# Patient Record
Sex: Female | Born: 1955 | Race: White | Hispanic: No | Marital: Single | Smoking: Former smoker
Health system: Southern US, Community
[De-identification: ages and names within clinical notes are randomized; demographics above are authoritative.]

## PROBLEM LIST (undated history)

## (undated) DIAGNOSIS — K219 Gastro-esophageal reflux disease without esophagitis: Secondary | ICD-10-CM

## (undated) DIAGNOSIS — G4730 Sleep apnea, unspecified: Secondary | ICD-10-CM

## (undated) DIAGNOSIS — J449 Chronic obstructive pulmonary disease, unspecified: Secondary | ICD-10-CM

## (undated) DIAGNOSIS — J45909 Unspecified asthma, uncomplicated: Secondary | ICD-10-CM

## (undated) DIAGNOSIS — I7300 Raynaud's syndrome without gangrene: Secondary | ICD-10-CM

## (undated) DIAGNOSIS — R002 Palpitations: Secondary | ICD-10-CM

## (undated) DIAGNOSIS — E785 Hyperlipidemia, unspecified: Secondary | ICD-10-CM

## (undated) HISTORY — DX: Gastro-esophageal reflux disease without esophagitis: K21.9

## (undated) HISTORY — DX: Sleep apnea, unspecified: G47.30

## (undated) HISTORY — DX: Raynaud's syndrome without gangrene: I73.00

## (undated) HISTORY — DX: Chronic obstructive pulmonary disease, unspecified: J44.9

## (undated) HISTORY — DX: Hyperlipidemia, unspecified: E78.5

## (undated) HISTORY — DX: Palpitations: R00.2

## (undated) HISTORY — DX: Unspecified asthma, uncomplicated: J45.909

---
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
## 1978-10-31 HISTORY — PX: APPENDECTOMY: SHX54

## 1985-10-31 HISTORY — PX: ABDOMINAL HYSTERECTOMY: SHX81

## 2009-08-19 ENCOUNTER — Inpatient Hospital Stay: Payer: Self-pay | Admitting: Internal Medicine

## 2010-06-25 IMAGING — CT CT HEAD WITHOUT CONTRAST
2 series · 16 of 30 positions shown, 20 images · non-contrast
Comparison: none

REASON FOR EXAM: Lt. facial numbness and LUE numbness
COMMENTS:

[Series 2: without · axial · non-contrast · 0.40mm/px · z∈[+1022,+1142]mm · 13 of 29 slices shown, 17 images]
[im 3/29  brain]
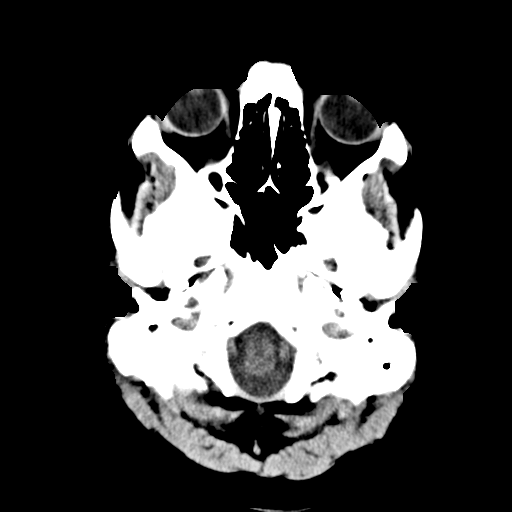
[im 3/29  bone]
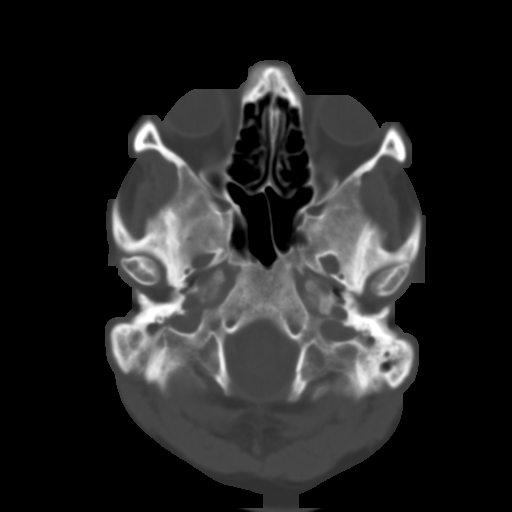
[im 5/29  brain]
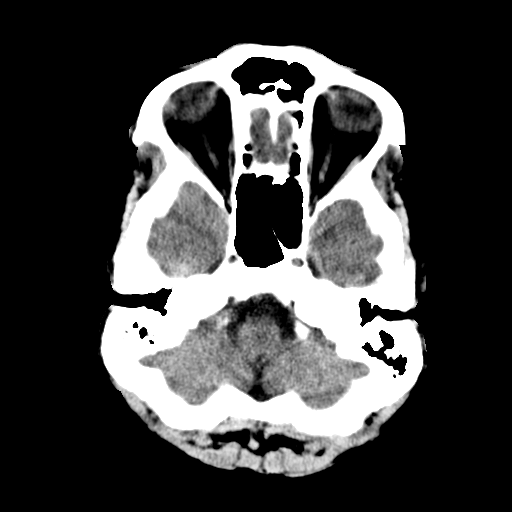
[im 7/29  brain]
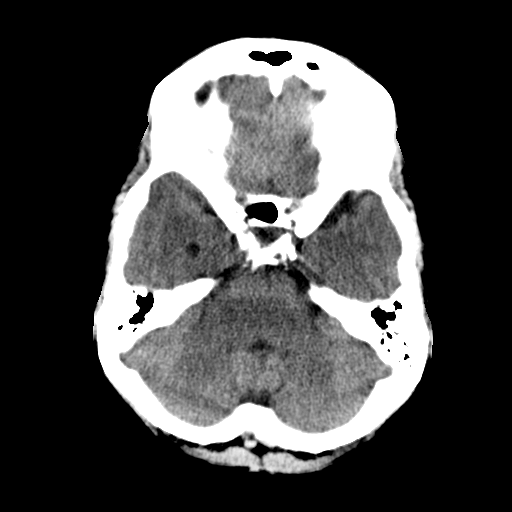
[im 9/29  brain]
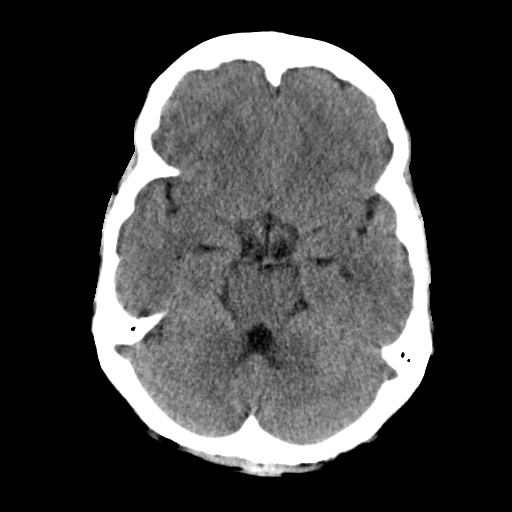
[im 11/29  brain]
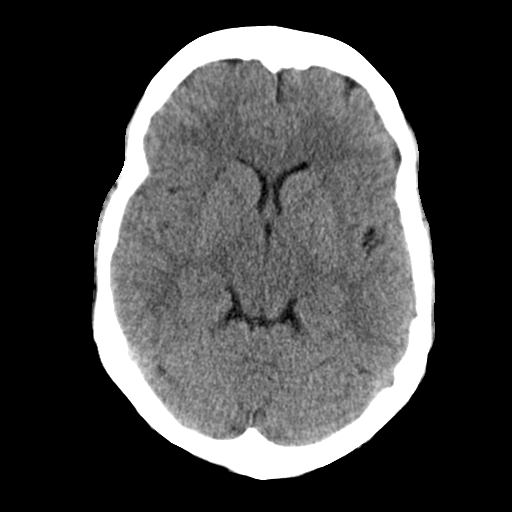
[im 11/29  bone]
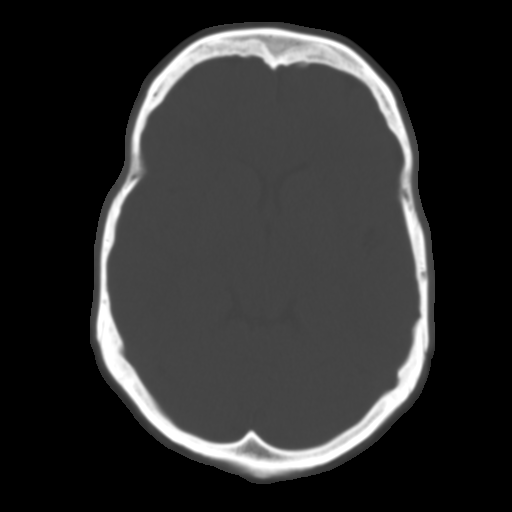
[im 13/29  brain]
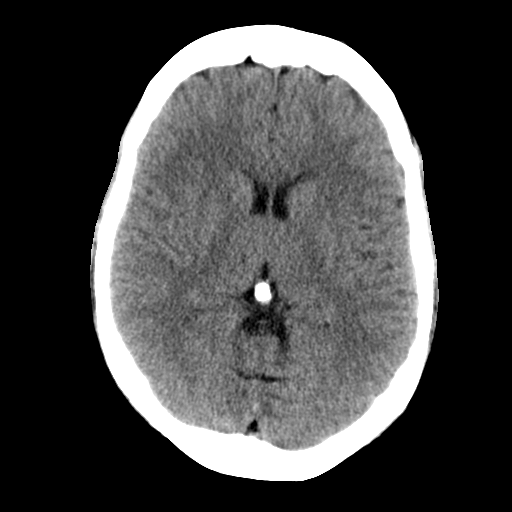
[im 15/29  brain]
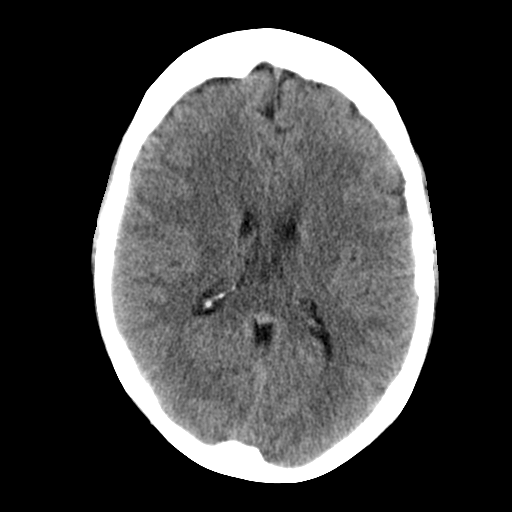
[im 17/29  brain]
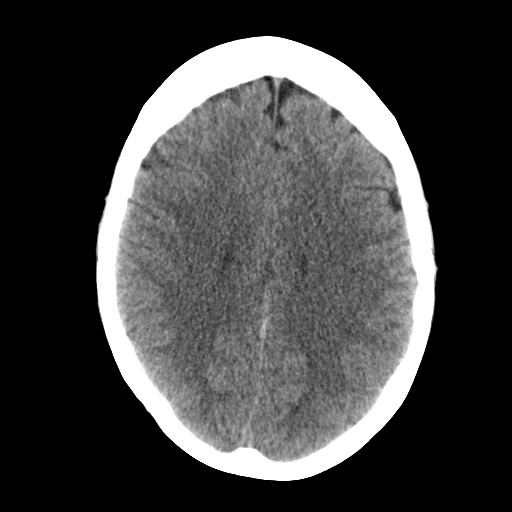
[im 19/29  brain]
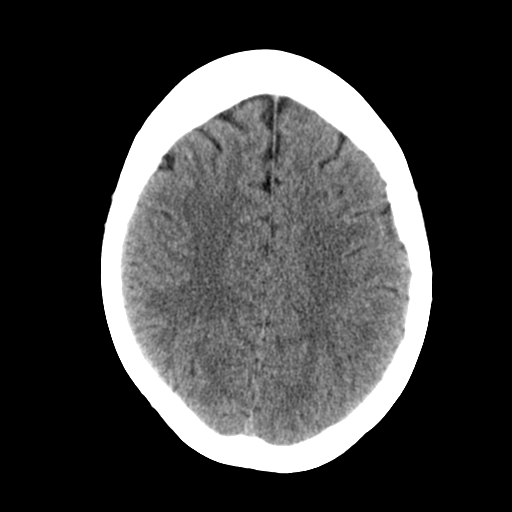
[im 19/29  bone]
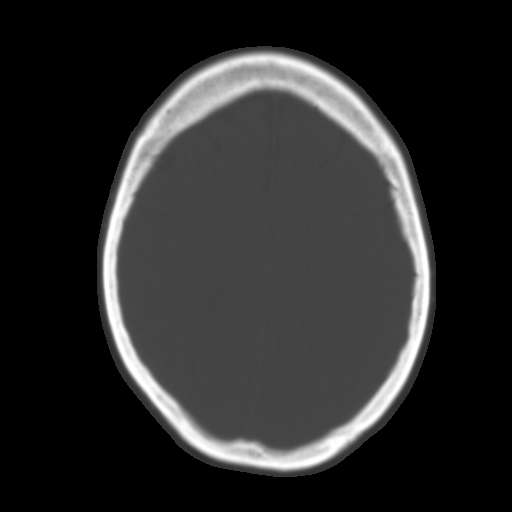
[im 21/29  brain]
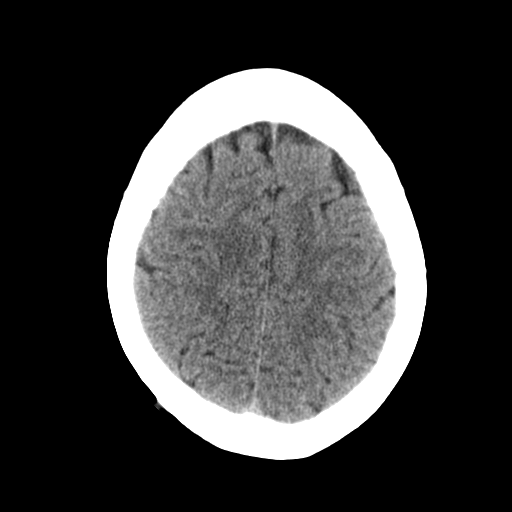
[im 23/29  brain]
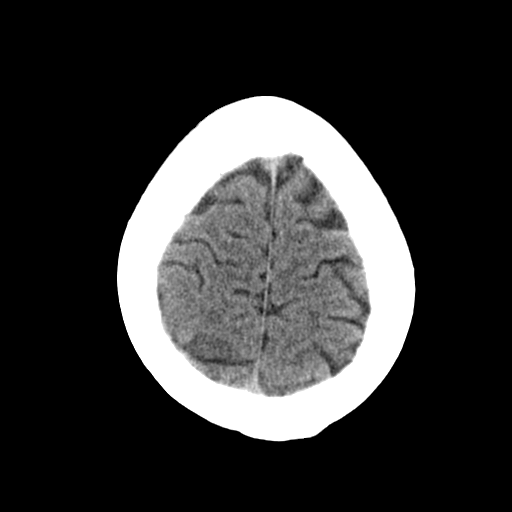
[im 25/29  brain]
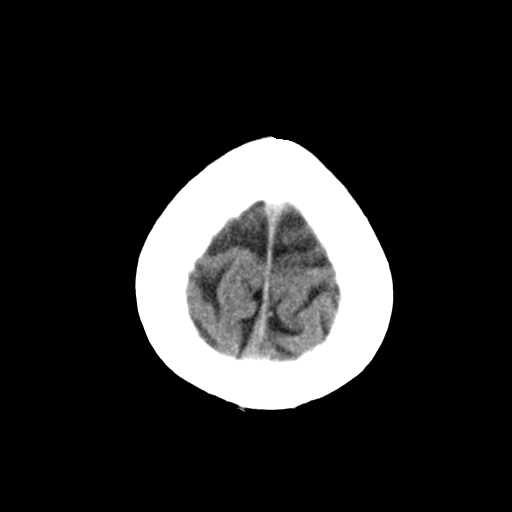
[im 27/29  brain]
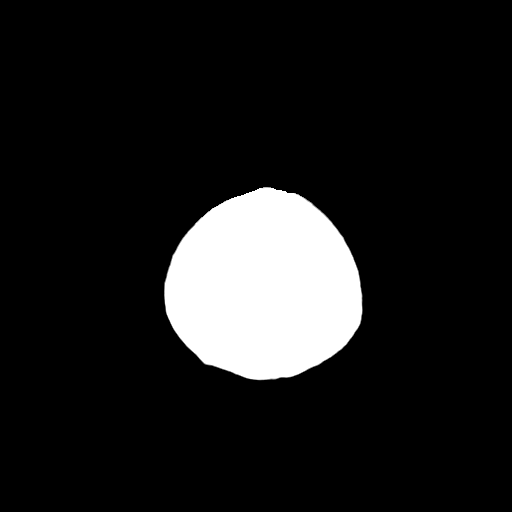
[im 27/29  bone]
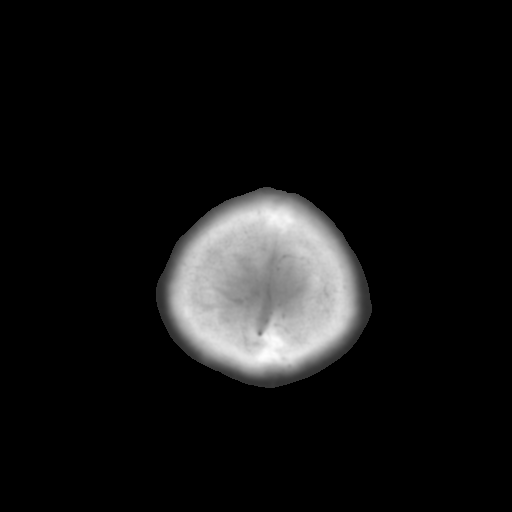

[Series 3: bone · axial · 0.40mm/px · z∈[+1022,+1062]mm · 3 of 29 slices shown]
[im 3/29  bone]
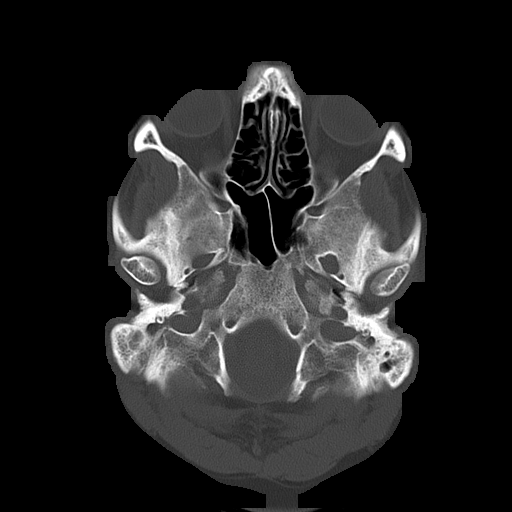
[im 7/29  bone]
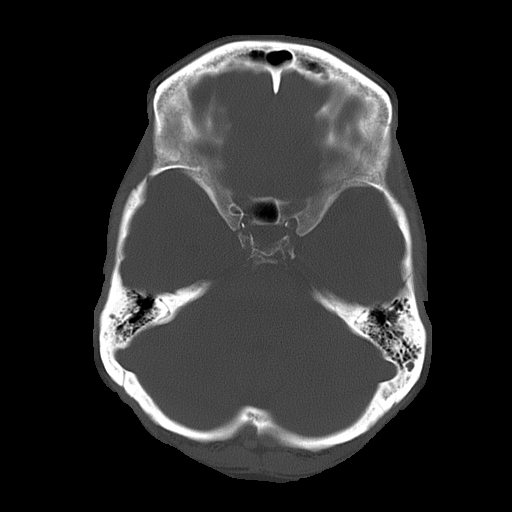
[im 11/29  bone]
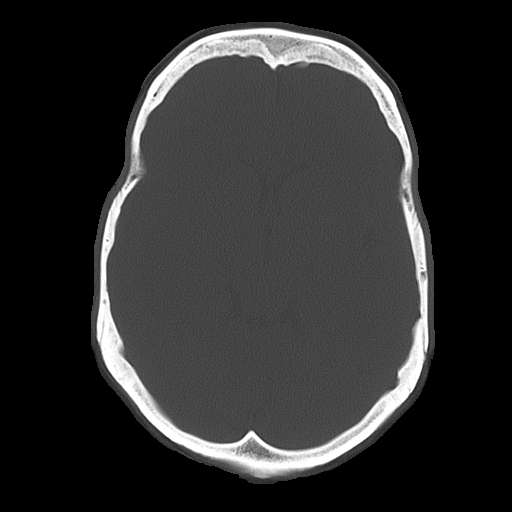

[16 of 30 positions shown; findings below may reference images not displayed]

PROCEDURE:     CT  - CT HEAD WITHOUT CONTRAST  - August 19, 2009 [DATE]

RESULT:     Axial noncontrast CT scanning was performed through the brain at
5 mm intervals and slice thicknesses.

The ventricles are normal in size and position. There is no intracranial
hemorrhage nor intracranial mass effect. There is no evidence of an evolving
ischemic infarction. At bone window settings the observed portions of the
paranasal sinuses are clear. There is no evidence of an acute skull fracture.
IMPRESSION: I see no acute intracranial abnormality. Subcutaneous
nodules within the scalp likely reflect sebaceous cysts.

A preliminary report was sent to the [HOSPITAL] the conclusion
of the study.

## 2010-10-01 ENCOUNTER — Emergency Department: Payer: Self-pay | Admitting: Emergency Medicine

## 2011-12-14 ENCOUNTER — Ambulatory Visit: Payer: Self-pay

## 2013-01-24 ENCOUNTER — Ambulatory Visit: Payer: Self-pay

## 2013-02-15 ENCOUNTER — Ambulatory Visit: Payer: Self-pay | Admitting: Family Medicine

## 2013-09-11 ENCOUNTER — Ambulatory Visit: Payer: Self-pay | Admitting: Gastroenterology

## 2013-11-30 ENCOUNTER — Ambulatory Visit: Payer: Self-pay | Admitting: Emergency Medicine

## 2013-11-30 LAB — RAPID STREP-A WITH REFLX: MICRO TEXT REPORT: NEGATIVE

## 2013-11-30 IMAGING — CR DG CHEST 2V
1 series · 2 of 2 positions shown · non-contrast
Comparison: none

REASON FOR EXAM: contusion
COMMENTS:

[Series 1: pa · 0.17mm/px · 2 of 2 slices shown]
[im 1/2]
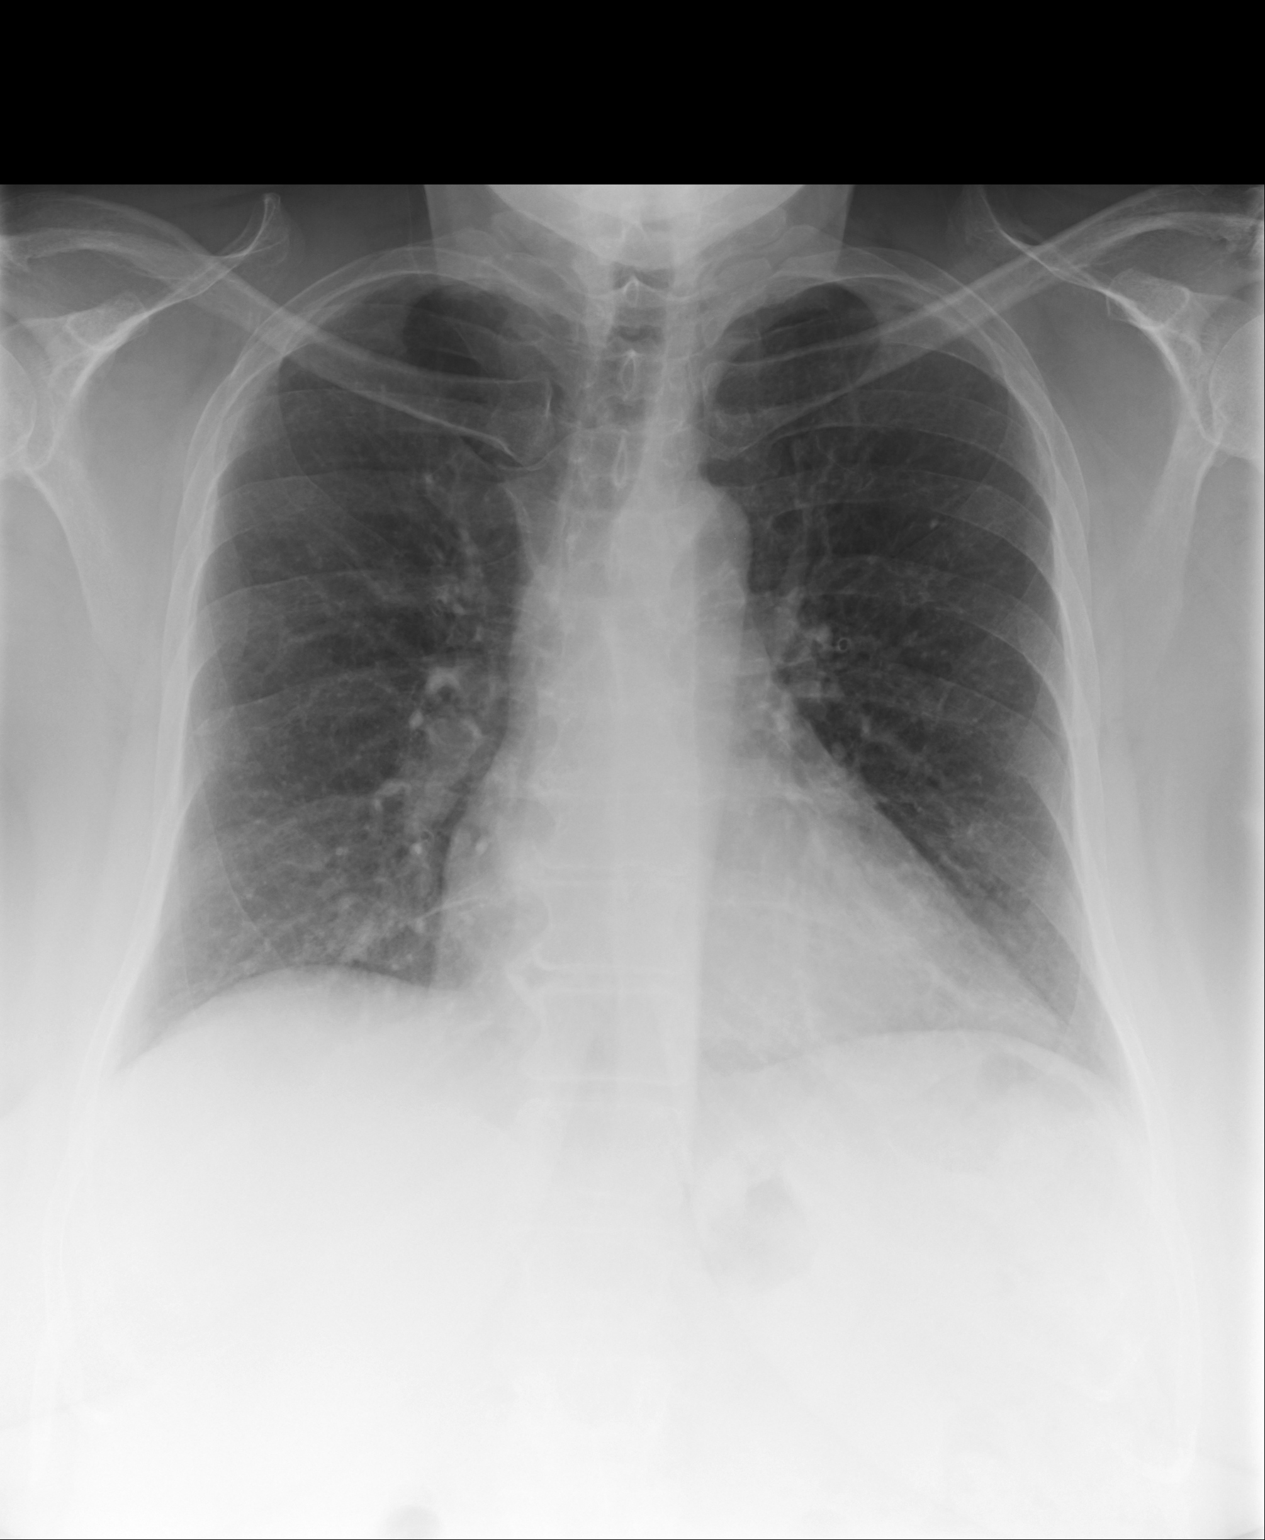
[im 2/2]
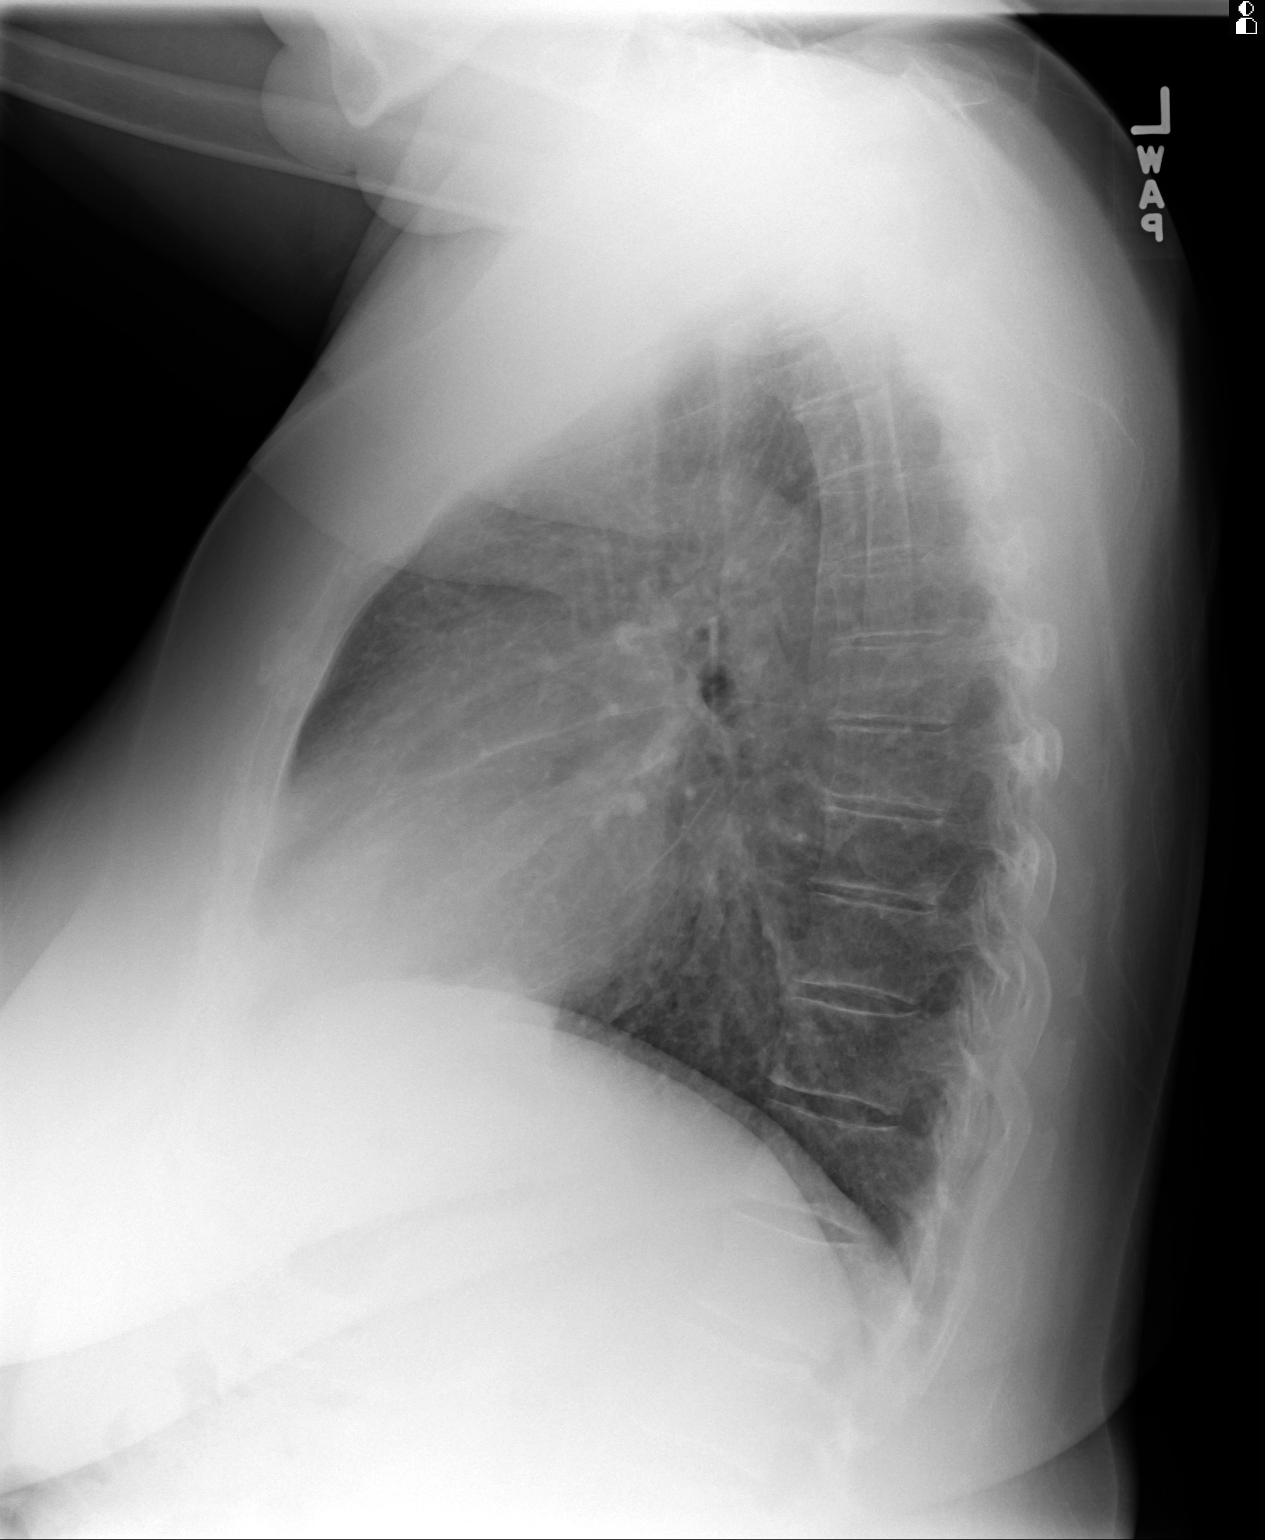

[2 of 2 positions shown; findings below may reference images not displayed]

PROCEDURE:     MDR - MDR CHEST PA(OR AP) AND LATERAL  - January 24, 2013  [DATE]

RESULT:     Comparison is made to the study 19 August, 2009. The lungs
are clear. The heart and pulmonary vessels are normal. The bony and
mediastinal structures are unremarkable. There is no effusion. There is no
pneumothorax or evidence of congestive failure.
IMPRESSION: No acute cardiopulmonary disease.

[REDACTED]

## 2013-11-30 IMAGING — CR DG RIBS 2V*R*
1 series · 2 of 2 positions shown · non-contrast
Comparison: none

REASON FOR EXAM: contusion
COMMENTS:

PROCEDURE:     MDR - MDR RIBS RIGHT UNLILATERAL  - January 24, 2013  [DATE]
RESULT:     Only 2 projections were obtained. Is reported pain in the right
axillary region. No gross bony abnormality is evident.

[Series 1: ap · 0.17mm/px · 2 of 2 slices shown]
[im 1/2]
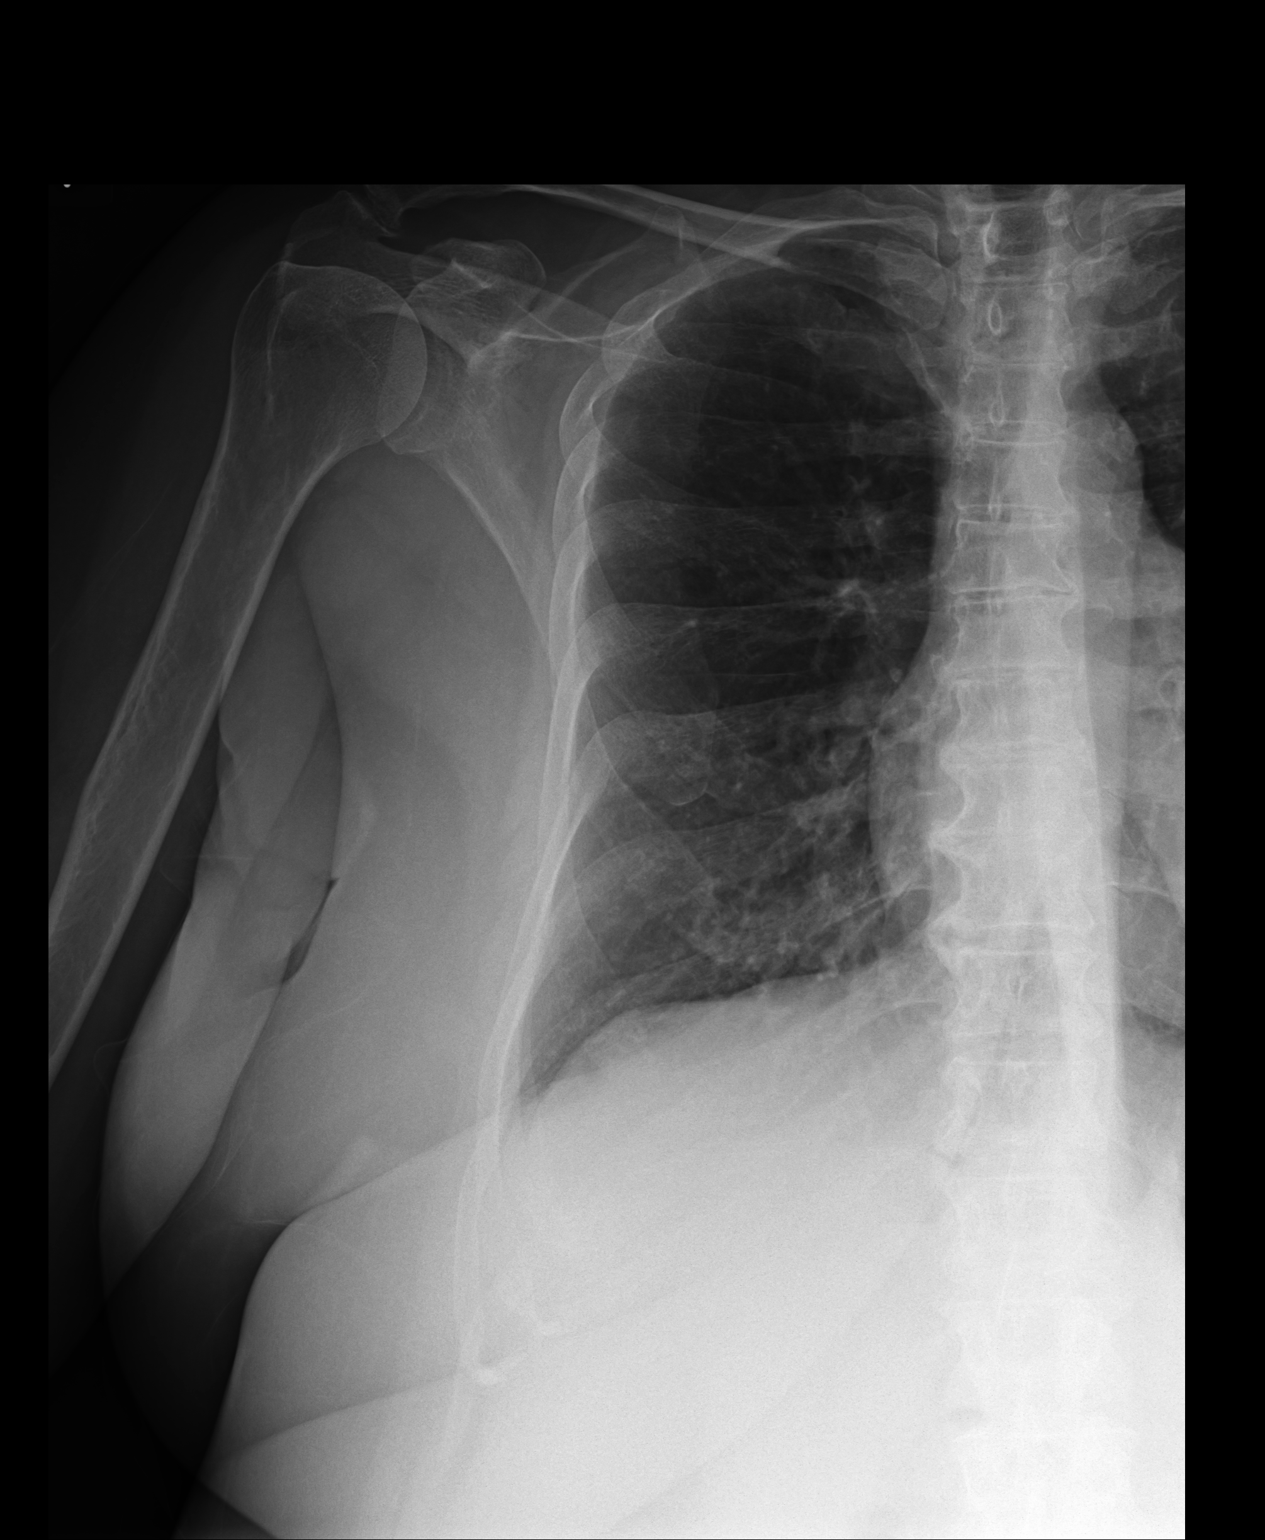
[im 2/2]
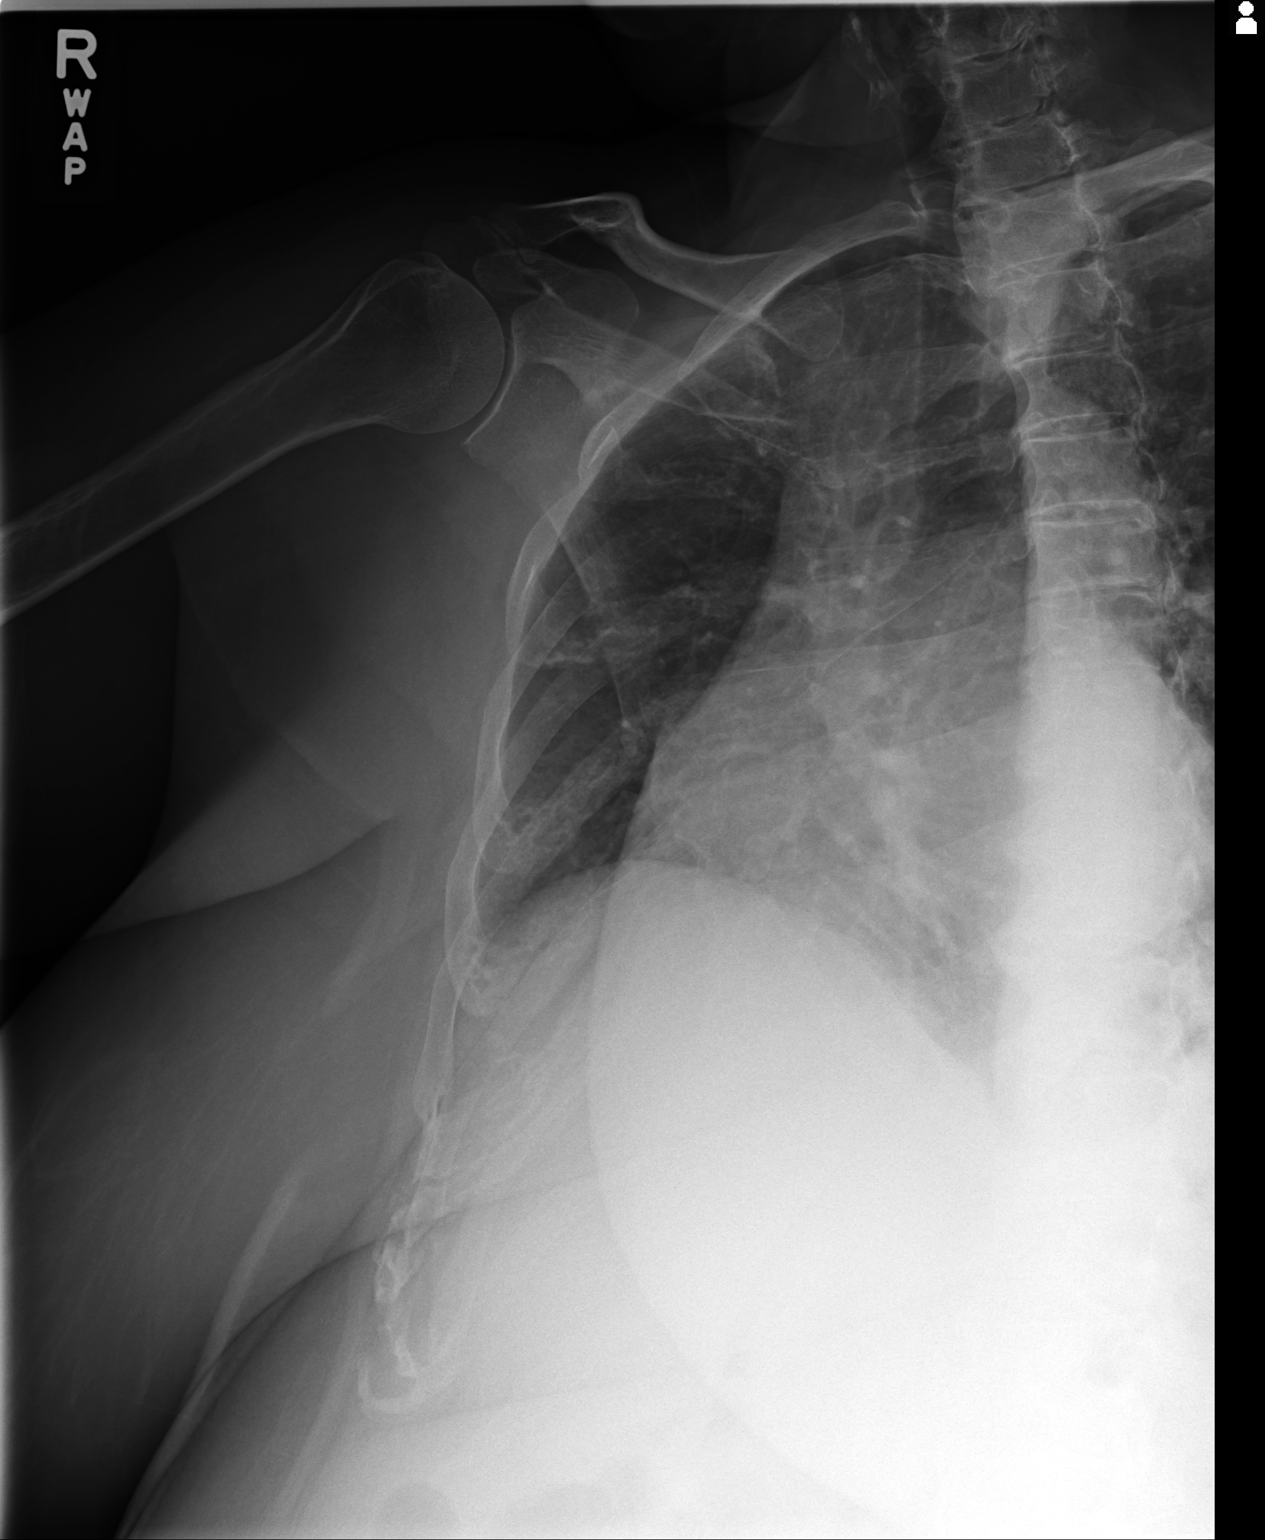

[2 of 2 positions shown; findings below may reference images not displayed]

IMPRESSION: Limited two-view study without acute abnormality. Area of
tenderness was not marked.

[REDACTED]

## 2013-12-03 LAB — BETA STREP CULTURE(ARMC)

## 2014-10-31 HISTORY — PX: INCONTINENCE SURGERY: SHX676

## 2014-10-31 HISTORY — PX: CARPAL TUNNEL RELEASE: SHX101

## 2016-02-23 ENCOUNTER — Telehealth: Payer: Self-pay | Admitting: Surgery

## 2016-02-23 NOTE — Telephone Encounter (Signed)
Patient called today to pay off her account balance. She thought she had a balance from 2015. I looked in Epic and also in our old system, [employer], and all I could find was a $0.00 balance. I gave patient that information and told her I would made a note to her account.

## 2019-11-01 HISTORY — PX: REPLACEMENT TOTAL KNEE: SUR1224

## 2021-08-20 ENCOUNTER — Other Ambulatory Visit: Payer: Self-pay

## 2021-08-20 ENCOUNTER — Inpatient Hospital Stay: Payer: BC Managed Care – PPO | Attending: Oncology | Admitting: Oncology

## 2021-08-20 ENCOUNTER — Encounter: Payer: Self-pay | Admitting: Oncology

## 2021-08-20 ENCOUNTER — Inpatient Hospital Stay: Payer: BC Managed Care – PPO

## 2021-08-20 VITALS — BP 106/70 | HR 55 | Temp 98.2°F | Resp 16 | Wt <= 1120 oz

## 2021-08-20 DIAGNOSIS — Z90710 Acquired absence of both cervix and uterus: Secondary | ICD-10-CM | POA: Insufficient documentation

## 2021-08-20 DIAGNOSIS — Z8619 Personal history of other infectious and parasitic diseases: Secondary | ICD-10-CM | POA: Insufficient documentation

## 2021-08-20 DIAGNOSIS — D696 Thrombocytopenia, unspecified: Secondary | ICD-10-CM | POA: Insufficient documentation

## 2021-08-20 DIAGNOSIS — Z806 Family history of leukemia: Secondary | ICD-10-CM | POA: Diagnosis not present

## 2021-08-20 DIAGNOSIS — Z87891 Personal history of nicotine dependence: Secondary | ICD-10-CM | POA: Diagnosis not present

## 2021-08-20 DIAGNOSIS — R748 Abnormal levels of other serum enzymes: Secondary | ICD-10-CM | POA: Insufficient documentation

## 2021-08-20 DIAGNOSIS — R911 Solitary pulmonary nodule: Secondary | ICD-10-CM | POA: Insufficient documentation

## 2021-08-20 DIAGNOSIS — Z801 Family history of malignant neoplasm of trachea, bronchus and lung: Secondary | ICD-10-CM | POA: Diagnosis not present

## 2021-08-20 LAB — CBC WITH DIFFERENTIAL/PLATELET
Abs Immature Granulocytes: 0.02 10*3/uL (ref 0.00–0.07)
Basophils Absolute: 0.1 10*3/uL (ref 0.0–0.1)
Basophils Relative: 1 %
Eosinophils Absolute: 0.1 10*3/uL (ref 0.0–0.5)
Eosinophils Relative: 1 %
HCT: 40.1 % (ref 36.0–46.0)
Hemoglobin: 13.4 g/dL (ref 12.0–15.0)
Immature Granulocytes: 1 %
Lymphocytes Relative: 24 %
Lymphs Abs: 1 10*3/uL (ref 0.7–4.0)
MCH: 31.6 pg (ref 26.0–34.0)
MCHC: 33.4 g/dL (ref 30.0–36.0)
MCV: 94.6 fL (ref 80.0–100.0)
Monocytes Absolute: 0.4 10*3/uL (ref 0.1–1.0)
Monocytes Relative: 9 %
Neutro Abs: 2.7 10*3/uL (ref 1.7–7.7)
Neutrophils Relative %: 64 %
Platelets: 165 10*3/uL (ref 150–400)
RBC: 4.24 MIL/uL (ref 3.87–5.11)
RDW: 14.4 % (ref 11.5–15.5)
WBC: 4.2 10*3/uL (ref 4.0–10.5)
nRBC: 0 % (ref 0.0–0.2)

## 2021-08-20 LAB — TECHNOLOGIST SMEAR REVIEW
Plt Morphology: NORMAL
RBC MORPHOLOGY: NORMAL
WBC MORPHOLOGY: NORMAL

## 2021-08-20 LAB — COMPREHENSIVE METABOLIC PANEL
ALT: 29 U/L (ref 0–44)
AST: 30 U/L (ref 15–41)
Albumin: 4.4 g/dL (ref 3.5–5.0)
Alkaline Phosphatase: 154 U/L — ABNORMAL HIGH (ref 38–126)
Anion gap: 9 (ref 5–15)
BUN: 28 mg/dL — ABNORMAL HIGH (ref 8–23)
CO2: 30 mmol/L (ref 22–32)
Calcium: 8.9 mg/dL (ref 8.9–10.3)
Chloride: 99 mmol/L (ref 98–111)
Creatinine, Ser: 0.97 mg/dL (ref 0.44–1.00)
GFR, Estimated: >60 mL/min (ref >60)
Glucose, Bld: 85 mg/dL (ref 70–99)
Potassium: 3.9 mmol/L (ref 3.5–5.1)
Sodium: 138 mmol/L (ref 135–145)
Total Bilirubin: 0.6 mg/dL (ref 0.3–1.2)
Total Protein: 7 g/dL (ref 6.5–8.1)

## 2021-08-20 LAB — VITAMIN B12: Vitamin B-12: 438 pg/mL (ref 180–914)

## 2021-08-20 LAB — IMMATURE PLATELET FRACTION: Immature Platelet Fraction: 11.6 % — ABNORMAL HIGH (ref 1.2–8.6)

## 2021-08-20 LAB — HIV ANTIBODY (ROUTINE TESTING W REFLEX): HIV Screen 4th Generation wRfx: NONREACTIVE

## 2021-08-20 LAB — FOLATE: Folate: 40 ng/mL (ref >5.9)

## 2021-08-20 NOTE — Progress Notes (Signed)
Hematology/Oncology Consult note [facility] [phone] [phone]   Patient Care Team: Juanda Bond, NP as PCP - General (Family Medicine)  REFERRING PROVIDER: Juanda Bond, NP  CHIEF COMPLAINTS/REASON FOR VISIT:  Evaluation of thrombocytopenia  HISTORY OF PRESENTING ILLNESS:  Stacy Kline is a 65 y.o. female who was seen in consultation at the request of Juanda Bond, NP for evaluation of thrombocytopenia.   Reviewed patient's labs done previously.  Labs are not available to me.  Patient showed her results to me via [place] on her cell phone  08/04/2021 labs showed decreased platelet counts at 138,000. wbc 9.3 hemoglobin 12.8 hematocrit 39.5, MCV 95 creatinine 1.13, EGFR 55  bilirubin 0.3, alkaline phosphatase 147 Reviewed patient's previous labs. Thrombocytopenia is chronic chronic onset , since at least 2016.Marland Kitchen  Her count is above 100,000. No aggravating or elevated factors.  Associated symptoms or signs:  Denies weight loss, fever, chills, fatigue, night sweats.  Denies hematochezia, hematuria, hematemesis, epistaxis, black tarry stool.  No easy bruising.  She drinks alcohol occasionally.  Former smoker.  Quit in 2003.  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  He reports that she was treated with interferon. 12/29/2020, hepatitis panel showed hep B core antibody reactive, surface antigen nonreactive,HBV DNA quantification not detected. HBS antibody reactive.  Review of Systems  Constitutional:  Negative for appetite change, chills, fatigue and fever.  HENT:   Negative for hearing loss and voice change.   Eyes:  Negative for eye problems.  Respiratory:  Negative for chest tightness and cough.   Cardiovascular:  Negative for chest pain.  Gastrointestinal:  Negative for abdominal distention, abdominal pain and blood in stool.  Endocrine: Negative for hot flashes.  Genitourinary:  Negative for difficulty urinating and  frequency.   Musculoskeletal:  Negative for arthralgias.  Skin:  Negative for itching and rash.  Neurological:  Negative for extremity weakness.  Hematological:  Negative for adenopathy.  Psychiatric/Behavioral:  Negative for confusion.    MEDICAL HISTORY:  Past Medical History:  Diagnosis Date   Asthma    COPD (chronic obstructive pulmonary disease) (HCC)    GERD (gastroesophageal reflux disease)    Heart palpitations    Hyperlipemia    Raynaud disease    Sleep apnea     SURGICAL HISTORY: Past Surgical History:  Procedure Laterality Date   ABDOMINAL HYSTERECTOMY  1987   APPENDECTOMY  1980   CARPAL TUNNEL RELEASE  2016   eye muscle correction  [place]  2016   REPLACEMENT TOTAL KNEE Right 2021    SOCIAL HISTORY: Social History   Socioeconomic History   Marital status: Single    Spouse name: Not on file   Number of children: Not on file   Years of education: Not on file   Highest education level: Not on file  Occupational History   Not on file  Tobacco Use   Smoking status: Former    Types: Cigarettes    Quit date: 08/20/2002    Years since quitting: 19.0   Smokeless tobacco: Never  Substance and Sexual Activity   Alcohol use: Yes    Comment: occasional   Drug use: Never   Sexual activity: Not Currently  Other Topics Concern   Not on file  Social History Narrative   Not on file   Social Determinants of Health   Financial Resource Strain: Not on file  Food Insecurity: Not on file  Transportation Needs: Not on file  Physical Activity:  Not on file  Stress: Not on file  Social Connections: Not on file  Intimate Partner Violence: Not on file    FAMILY HISTORY: Family History  Problem Relation Age of Onset   Lung cancer Mother    Lung cancer Sister    Leukemia Paternal Grandmother     ALLERGIES:  is allergic to bee venom, morphine, and penicillins.  MEDICATIONS:  Current Outpatient Medications  Medication Sig Dispense Refill    Acetylcysteine (N-ACETYL-L-CYSTEINE) 600 MG CAPS Take by mouth in the morning and at bedtime.     atorvastatin (LIPITOR) 20 MG tablet Take 20 mg by mouth daily.     b complex vitamins capsule Take 1 capsule by mouth daily.     BIOTIN PO Take by mouth.     busPIRone (BUSPAR) 15 MG tablet Take 15 mg by mouth 2 (two) times daily.     Cholecalciferol (VITAMIN D) 125 MCG (5000 UT) CAPS Take by mouth.     Coenzyme Q10 (CO Q 10) 100 MG CAPS Take by mouth daily.     furosemide (LASIX) 20 MG tablet Take 20 mg by mouth daily.     loratadine (CLARITIN) 10 MG tablet Take 10 mg by mouth daily.     MAGNESIUM GLUCONATE PO Take by mouth.     metoprolol tartrate (LOPRESSOR) 50 MG tablet Take 50 mg by mouth 2 (two) times daily.     montelukast (SINGULAIR) 10 MG tablet Take 10 mg by mouth at bedtime.     Multiple Vitamins-Minerals (CENTRUM SILVER PO) Take by mouth.     omeprazole (PRILOSEC) 10 MG capsule Take 10 mg by mouth daily as needed.     vitamin C (ASCORBIC ACID) 500 MG tablet Take 500 mg by mouth daily.     Zinc 50 MG CAPS Take by mouth daily.     No current facility-administered medications for this visit.     PHYSICAL EXAMINATION: ECOG PERFORMANCE STATUS: 0 - Asymptomatic Vitals:   08/20/21 0947  BP: 106/70  Pulse: (!) 55  Resp: 16  Temp: 98.2 F (36.8 C)   Filed Weights   08/20/21 0947  Weight: 6 lb 12.8 oz (3.084 kg)    Physical Exam Constitutional:      General: She is not in acute distress. HENT:     Head: Normocephalic and atraumatic.  Eyes:     General: No scleral icterus. Cardiovascular:     Rate and Rhythm: Normal rate and regular rhythm.     Heart sounds: Normal heart sounds.  Pulmonary:     Effort: Pulmonary effort is normal. No respiratory distress.     Breath sounds: No wheezing.  Abdominal:     General: Bowel sounds are normal. There is no distension.     Palpations: Abdomen is soft.  Musculoskeletal:        General: No deformity. Normal range of motion.      Cervical back: Normal range of motion and neck supple.  Skin:    General: Skin is warm and dry.     Findings: No erythema or rash.  Neurological:     Mental Status: She is alert and oriented to person, place, and time. Mental status is at baseline.     Cranial Nerves: No cranial nerve deficit.     Coordination: Coordination normal.  Psychiatric:        Mood and Affect: Mood normal.     LABORATORY DATA:  I have reviewed the data as listed Lab Results  Component Value  Date   WBC 4.2 08/20/2021   HGB 13.4 08/20/2021   HCT 40.1 08/20/2021   MCV 94.6 08/20/2021   PLT 165 08/20/2021   Recent Labs    08/20/21 1040  NA 138  K 3.9  CL 99  CO2 30  GLUCOSE 85  BUN 28*  CREATININE 0.97  CALCIUM 8.9  GFRNONAA >60  PROT 7.0  ALBUMIN 4.4  AST 30  ALT 29  ALKPHOS 154*  BILITOT 0.6   Iron/TIBC/Ferritin/ %Sat No results found for: IRON, TIBC, FERRITIN, IRONPCTSAT    RADIOGRAPHIC STUDIES: I have personally reviewed the radiological images as listed and agreed with the findings in the report.  No results found.  No recent images in current [place] EMR. Via Care Everywhere, patient had CT chest without contrast on 04/21/2021 at [facility] Stable 0.6 cm perifissural nodule in the right upper lobe.  ASSESSMENT & PLAN:  1. Thrombocytopenia (HCC)   2. Elevated alkaline phosphatase level    For the work up of patient's thrombocytopenia, I recommend checking CBC;CMP, LDH; smear review, folate, Vitamin B12, HIV, ANA,  flowcytometry and monoclonal gammopathy workup. Will also check ultrasound of the abdomen.  Elevated alkaline phosphatase.  This is chronic.  Pending above work-up and ultrasound. # Patient follow-up with me in approximately 2 weeks to review the above results.   Orders Placed This Encounter  Procedures   US Abdomen Complete    Standing Status:   Future    Standing Expiration Date:   08/20/2022    Order Specific Question:   Reason for Exam (SYMPTOM  OR DIAGNOSIS  REQUIRED)    Answer:   thrombocytopenia    Order Specific Question:   Preferred imaging location?    Answer:   [facility]   Technologist smear review    Standing Status:   Future    Number of Occurrences:   1    Standing Expiration Date:   11/01/2021   CBC with Differential/Platelet    Standing Status:   Future    Number of Occurrences:   1    Standing Expiration Date:   11/01/2021   Comprehensive metabolic panel    Standing Status:   Future    Number of Occurrences:   1    Standing Expiration Date:   11/01/2021   Immature Platelet Fraction    Standing Status:   Future    Number of Occurrences:   1    Standing Expiration Date:   11/01/2021   ANA, IFA (with reflex)    Standing Status:   Future    Number of Occurrences:   1    Standing Expiration Date:   11/01/2021   Flow cytometry panel-leukemia/lymphoma work-up    Standing Status:   Future    Number of Occurrences:   1    Standing Expiration Date:   11/01/2021   Vitamin B12    Standing Status:   Future    Number of Occurrences:   1    Standing Expiration Date:   11/01/2021   Folate    Standing Status:   Future    Number of Occurrences:   1    Standing Expiration Date:   11/01/2021   HIV Antibody (routine testing w rflx)    Standing Status:   Future    Number of Occurrences:   1    Standing Expiration Date:   11/01/2021    All questions were answered. The patient knows to call the clinic with any problems questions  or concerns.  Cc Tackitt, Helen, NP  Return of visit: 2-3 weeks.  Thank you for this kind referral and the opportunity to participate in the care of this patient. A copy of today's note is routed to referring provider    Earlie Server, MD, PhD 08/20/2021

## 2021-08-20 NOTE — Progress Notes (Signed)
New patient evaluation.   

## 2021-08-23 LAB — ANTINUCLEAR ANTIBODIES, IFA: ANA Ab, IFA: POSITIVE — AB

## 2021-08-23 LAB — FANA STAINING PATTERNS: Homogeneous Pattern: 1 — ABNORMAL HIGH

## 2021-08-24 LAB — COMP PANEL: LEUKEMIA/LYMPHOMA

## 2021-08-30 ENCOUNTER — Ambulatory Visit
Admission: RE | Admit: 2021-08-30 | Discharge: 2021-08-30 | Disposition: A | Discharge status: Home / Self Care | Payer: BC Managed Care – PPO | Source: Ambulatory Visit | Attending: Oncology | Admitting: Oncology

## 2021-08-30 ENCOUNTER — Other Ambulatory Visit: Payer: Self-pay

## 2021-08-30 DIAGNOSIS — D696 Thrombocytopenia, unspecified: Secondary | ICD-10-CM | POA: Diagnosis not present

## 2021-09-09 ENCOUNTER — Inpatient Hospital Stay: Payer: BC Managed Care – PPO | Attending: Oncology | Admitting: Oncology

## 2021-09-09 ENCOUNTER — Telehealth: Payer: Self-pay

## 2021-09-09 ENCOUNTER — Other Ambulatory Visit: Payer: Self-pay

## 2021-09-09 ENCOUNTER — Encounter: Payer: Self-pay | Admitting: Oncology

## 2021-09-09 VITALS — BP 101/62 | HR 57 | Temp 97.8°F | Resp 20 | Wt 207.8 lb

## 2021-09-09 DIAGNOSIS — D696 Thrombocytopenia, unspecified: Secondary | ICD-10-CM | POA: Diagnosis present

## 2021-09-09 DIAGNOSIS — Z87891 Personal history of nicotine dependence: Secondary | ICD-10-CM | POA: Diagnosis not present

## 2021-09-09 DIAGNOSIS — I7300 Raynaud's syndrome without gangrene: Secondary | ICD-10-CM | POA: Diagnosis not present

## 2021-09-09 DIAGNOSIS — R748 Abnormal levels of other serum enzymes: Secondary | ICD-10-CM | POA: Diagnosis not present

## 2021-09-09 DIAGNOSIS — Z801 Family history of malignant neoplasm of trachea, bronchus and lung: Secondary | ICD-10-CM | POA: Insufficient documentation

## 2021-09-09 DIAGNOSIS — R768 Other specified abnormal immunological findings in serum: Secondary | ICD-10-CM

## 2021-09-09 DIAGNOSIS — Z806 Family history of leukemia: Secondary | ICD-10-CM | POA: Diagnosis not present

## 2021-09-09 DIAGNOSIS — Z90710 Acquired absence of both cervix and uterus: Secondary | ICD-10-CM | POA: Insufficient documentation

## 2021-09-09 NOTE — Telephone Encounter (Signed)
Sent referral to Dr. Patel-Rheumatalogy at [facility]. Received a fax confirmation and sent to [phone].

## 2021-09-09 NOTE — Progress Notes (Signed)
Hematology/Oncology Consult note [facility] [phone] [phone]   Patient Care Team: Juanda Bond, NP as PCP - General (Family Medicine)  REFERRING PROVIDER: Juanda Bond, NP  CHIEF COMPLAINTS/REASON FOR VISIT:  Evaluation of thrombocytopenia  HISTORY OF PRESENTING ILLNESS:  Stacy Kline is a 65 y.o. female who was seen in consultation at the request of Juanda Bond, NP for evaluation of thrombocytopenia.   Reviewed patient's labs done previously.  Labs are not available to me.  Patient showed her results to me via [place] on her cell phone  08/04/2021 labs showed decreased platelet counts at 138,000. wbc 9.3 hemoglobin 12.8 hematocrit 39.5, MCV 95 creatinine 1.13, EGFR 55  bilirubin 0.3, alkaline phosphatase 147 Reviewed patient's previous labs. Thrombocytopenia is chronic chronic onset , since at least 2016.Marland Kitchen  Her count is above 100,000. No aggravating or elevated factors.  Associated symptoms or signs:  Denies weight loss, fever, chills, fatigue, night sweats.  Denies hematochezia, hematuria, hematemesis, epistaxis, black tarry stool.  No easy bruising.  She drinks alcohol occasionally.  Former smoker.  Quit in 2003.  Patient has a history of hepatitis in 4s.  He reports that she was treated with interferon. 12/29/2020, hepatitis panel showed hep B core antibody reactive, surface antigen nonreactive,HBV DNA quantification not detected. HBS antibody reactive.   INTERVAL HISTORY Stacy Kline is a 65 y.o. female who has above history reviewed by me today presents for follow up visit to go over results from her work up of thrombocytopenia.  No new complaints.  + Raynaud's phenomenon    Review of Systems  Constitutional:  Negative for appetite change, chills, fatigue and fever.  HENT:   Negative for hearing loss and voice change.   Eyes:  Negative for eye problems.  Respiratory:  Negative for chest tightness and  cough.   Cardiovascular:  Negative for chest pain.  Gastrointestinal:  Negative for abdominal distention, abdominal pain and blood in stool.  Endocrine: Negative for hot flashes.  Genitourinary:  Negative for difficulty urinating and frequency.   Musculoskeletal:  Negative for arthralgias.  Skin:  Negative for itching and rash.  Neurological:  Negative for extremity weakness.  Hematological:  Negative for adenopathy.  Psychiatric/Behavioral:  Negative for confusion.    MEDICAL HISTORY:  Past Medical History:  Diagnosis Date   Asthma    COPD (chronic obstructive pulmonary disease) (HCC)    GERD (gastroesophageal reflux disease)    Heart palpitations    Hyperlipemia    Raynaud disease    Sleep apnea     SURGICAL HISTORY: Past Surgical History:  Procedure Laterality Date   ABDOMINAL HYSTERECTOMY  1987   APPENDECTOMY  1980   CARPAL TUNNEL RELEASE  2016   eye muscle correction  1976   INCONTINENCE SURGERY  2016   REPLACEMENT TOTAL KNEE Right 2021    SOCIAL HISTORY: Social History   Socioeconomic History   Marital status: Single    Spouse name: Not on file   Number of children: Not on file   Years of education: Not on file   Highest education level: Not on file  Occupational History   Not on file  Tobacco Use   Smoking status: Former    Types: Cigarettes    Quit date: 08/20/2002    Years since quitting: 19.0   Smokeless tobacco: Never  Substance and Sexual Activity   Alcohol use: Yes    Comment: occasional   Drug use: Never   Sexual activity: Not Currently  Other  Topics Concern   Not on file  Social History Narrative   Not on file   Social Determinants of Health   Financial Resource Strain: Not on file  Food Insecurity: Not on file  Transportation Needs: Not on file  Physical Activity: Not on file  Stress: Not on file  Social Connections: Not on file  Intimate Partner Violence: Not on file    FAMILY HISTORY: Family History  Problem Relation Age of  Onset   Lung cancer Mother    Lung cancer Sister    Leukemia Paternal Grandmother     ALLERGIES:  is allergic to bee venom, morphine, and penicillins.  MEDICATIONS:  Current Outpatient Medications  Medication Sig Dispense Refill   Acetylcysteine (N-ACETYL-L-CYSTEINE) 600 MG CAPS Take by mouth in the morning and at bedtime.     atorvastatin (LIPITOR) 20 MG tablet Take 20 mg by mouth daily.     b complex vitamins capsule Take 1 capsule by mouth daily.     BIOTIN PO Take by mouth.     busPIRone (BUSPAR) 15 MG tablet Take 15 mg by mouth 2 (two) times daily.     Cholecalciferol (VITAMIN D) 125 MCG (5000 UT) CAPS Take by mouth.     Coenzyme Q10 (CO Q 10) 100 MG CAPS Take by mouth daily.     furosemide (LASIX) 20 MG tablet Take 20 mg by mouth daily.     loratadine (CLARITIN) 10 MG tablet Take 10 mg by mouth daily.     MAGNESIUM GLUCONATE PO Take by mouth.     metoprolol tartrate (LOPRESSOR) 50 MG tablet Take 50 mg by mouth 2 (two) times daily.     montelukast (SINGULAIR) 10 MG tablet Take 10 mg by mouth at bedtime.     Multiple Vitamins-Minerals (CENTRUM SILVER PO) Take by mouth.     omeprazole (PRILOSEC) 10 MG capsule Take 10 mg by mouth daily as needed.     vitamin C (ASCORBIC ACID) 500 MG tablet Take 500 mg by mouth daily.     Zinc 50 MG CAPS Take by mouth daily.     No current facility-administered medications for this visit.     PHYSICAL EXAMINATION: ECOG PERFORMANCE STATUS: 0 - Asymptomatic Vitals:   09/09/21 0842  BP: 101/62  Pulse: (!) 57  Resp: 20  Temp: 97.8 F (36.6 C)  SpO2: 100%   Filed Weights   09/09/21 0842  Weight: 207 lb 12.8 oz (94.3 kg)    Physical Exam Constitutional:      General: She is not in acute distress. HENT:     Head: Normocephalic and atraumatic.  Eyes:     General: No scleral icterus. Cardiovascular:     Rate and Rhythm: Normal rate and regular rhythm.     Heart sounds: Normal heart sounds.  Pulmonary:     Effort: Pulmonary effort is  normal. No respiratory distress.     Breath sounds: No wheezing.  Abdominal:     General: Bowel sounds are normal. There is no distension.     Palpations: Abdomen is soft.  Musculoskeletal:        General: No deformity. Normal range of motion.     Cervical back: Normal range of motion and neck supple.  Skin:    General: Skin is warm and dry.     Findings: No erythema or rash.  Neurological:     Mental Status: She is alert and oriented to person, place, and time. Mental status is at baseline.       Cranial Nerves: No cranial nerve deficit.     Coordination: Coordination normal.  Psychiatric:        Mood and Affect: Mood normal.     LABORATORY DATA:  I have reviewed the data as listed Lab Results  Component Value Date   WBC 4.2 08/20/2021   HGB 13.4 08/20/2021   HCT 40.1 08/20/2021   MCV 94.6 08/20/2021   PLT 165 08/20/2021   Recent Labs    08/20/21 1040  NA 138  K 3.9  CL 99  CO2 30  GLUCOSE 85  BUN 28*  CREATININE 0.97  CALCIUM 8.9  GFRNONAA >60  PROT 7.0  ALBUMIN 4.4  AST 30  ALT 29  ALKPHOS 154*  BILITOT 0.6    Iron/TIBC/Ferritin/ %Sat No results found for: IRON, TIBC, FERRITIN, IRONPCTSAT    RADIOGRAPHIC STUDIES: I have personally reviewed the radiological images as listed and agreed with the findings in the report.  US Abdomen Complete  Result Date: 08/30/2021 CLINICAL DATA:  Thrombocytopenia EXAM: ABDOMEN ULTRASOUND COMPLETE COMPARISON:  None. FINDINGS: Gallbladder: No gallstones or wall thickening visualized. No sonographic Murphy sign noted by sonographer. Common bile duct: Diameter: 2.5 mm Liver: No focal lesion identified. Within normal limits in parenchymal echogenicity. Portal vein is patent on color Doppler imaging with normal direction of blood flow towards the liver. IVC: No abnormality visualized. Pancreas: Pancreas is partly obscured. There is increased echogenicity in the visualized portions of pancreas, possibly suggesting fatty infiltration  Spleen: Spleen measures 9.7 cm in maximum diameter. No focal abnormalities are seen. Right Kidney: Length: 9.9. Echogenicity within normal limits. No mass or hydronephrosis visualized. Left Kidney: Length: 9.6. Echogenicity within normal limits. No mass or hydronephrosis visualized. Abdominal aorta: Visualized portions of aorta show atherosclerotic plaques and calcifications without demonstrable focal aneurysmal dilation. Other findings: None. IMPRESSION: No significant sonographic abnormality is seen in abdomen. Electronically Signed   By: Elmer Picker M.D.   On: 08/30/2021 15:55    No recent images in current [facility] EMR. Via Care Everywhere, patient had CT chest without contrast on 04/21/2021 at [facility] Stable 0.6 cm perifissural nodule in the right upper lobe.  ASSESSMENT & PLAN:  1. Thrombocytopenia (HCC)   2. Elevated alkaline phosphatase level   3. Raynaud's phenomenon without gangrene   4. Positive ANA (antinuclear antibody)    Thrombocytopenia Negative peripheral blood flowcytometry, normal LDH, B12, Folate, negative HIV, increased immature platelet fraction, indicating peripheral destruction, even thought no Thrombocytopenia on recent CBC.   Positive ANA, homogenous pattern 1:1280. Refer to rheumatology for work up of underlying autoimmune disease. Which may contribute to low platelets.   Elevated alkaline phosphatase.  This is chronic an trending down. Follow up with PCP US showed no liver lesion, splenomegaly.    Orders Placed This Encounter  Procedures   Ambulatory referral to Rheumatology    Referral Priority:   Routine    Referral Type:   Consultation    Referral Reason:   Specialty Services Required    Referred to Provider:   Quintin Alto, MD    Requested Specialty:   Rheumatology    Number of Visits Requested:   1    All questions were answered. The patient knows to call the clinic with any problems questions or concerns.  Cc Tackitt, Helen, NP  She will be  discharged from our clinic.    Earlie Server, MD, PhD 09/09/2021

## 2021-10-14 ENCOUNTER — Other Ambulatory Visit: Payer: Self-pay | Admitting: Nephrology

## 2021-10-14 DIAGNOSIS — N179 Acute kidney failure, unspecified: Secondary | ICD-10-CM

## 2021-12-08 ENCOUNTER — Other Ambulatory Visit: Payer: Self-pay | Admitting: Nephrology

## 2021-12-08 DIAGNOSIS — N179 Acute kidney failure, unspecified: Secondary | ICD-10-CM

## 2024-09-30 ENCOUNTER — Ambulatory Visit (INDEPENDENT_AMBULATORY_CARE_PROVIDER_SITE_OTHER)

## 2024-09-30 ENCOUNTER — Ambulatory Visit
Admission: EM | Admit: 2024-09-30 | Discharge: 2024-09-30 | Disposition: A | Discharge status: Home / Self Care | Attending: Family Medicine | Admitting: Family Medicine

## 2024-09-30 ENCOUNTER — Ambulatory Visit: Payer: Self-pay | Admitting: Nurse Practitioner

## 2024-09-30 DIAGNOSIS — J22 Unspecified acute lower respiratory infection: Secondary | ICD-10-CM

## 2024-09-30 DIAGNOSIS — R051 Acute cough: Secondary | ICD-10-CM

## 2024-09-30 DIAGNOSIS — J0110 Acute frontal sinusitis, unspecified: Secondary | ICD-10-CM

## 2024-09-30 MED ORDER — PROMETHAZINE-DM 6.25-15 MG/5ML PO SYRP: 5.0000 mL | ORAL_SOLUTION | Freq: Three times a day (TID) | ORAL | 0 refills | Status: AC | PRN

## 2024-09-30 NOTE — ED Triage Notes (Signed)
 Pt present cough with congestion, nasal drainage and facial pain, symptom started two weeks ago. Pt states tried OTC medication with no relief.

## 2024-09-30 NOTE — Discharge Instructions (Signed)
 You may take Promethazine DM as needed for your cough.  Please note this medication make you drowsy.  Do not drink alcohol or drive on this medication.  Please start the medications that were previously prescribed to you at your telehealth visit including your albuterol inhaler as needed, prednisone and doxycycline.  Monitor your symptoms closely and follow-up with your PCP in 2 to 3 days for recheck.  Please go to the emergency room if you develop any worsening symptoms.  Hope you feel better soon!

## 2024-09-30 NOTE — ED Provider Notes (Signed)
 [facility]    CSN: [id number] Arrival date & time: 09/30/24  0903      History   Chief Complaint Chief Complaint  Patient presents with   Cough    HPI Stacy Kline is a 68 y.o. female  presents for evaluation of URI symptoms for 2 weeks. Patient reports associated symptoms of cough, congestion, sinus pressure/pain with shortness of breath.  States over the past 2 to 3 days she has developed fevers.  Denies N/V/D, sore throat ear pain, body aches. Patient does have a hx of asthma.  Has albuterol inhaler that she has been using with temporary improvement.  Patient is a previous smoker.   Chart review shows history of COPD but patient reports she saw her pulmonologist last year and was told she does not have COPD.  Reports sick contacts via work.  Pt has taken cough medicine, Tylenol OTC for symptoms.  Patient states she did a virtual visit last night and although she was prescribed doxycycline, prednisone, and albuterol inhaler she was advised to be seen in person before picking up these medications.  Pt has no other concerns at this time.    Cough Associated symptoms: fever and shortness of breath     Past Medical History:  Diagnosis Date   Asthma    COPD (chronic obstructive pulmonary disease) (HCC)    GERD (gastroesophageal reflux disease)    Heart palpitations    Hyperlipemia    Raynaud disease    Sleep apnea     There are no active problems to display for this patient.   Past Surgical History:  Procedure Laterality Date   ABDOMINAL HYSTERECTOMY  1987   APPENDECTOMY  1980   CARPAL TUNNEL RELEASE  2016   eye muscle correction  1976   INCONTINENCE SURGERY  2016   REPLACEMENT TOTAL KNEE Right 2021    OB History   No obstetric history on file.      Home Medications    Prior to Admission medications   Medication Sig Start Date End Date Taking? Authorizing Provider  promethazine-dextromethorphan (PROMETHAZINE-DM) 6.25-15 MG/5ML syrup Take 5 mLs  by mouth 3 (three) times daily as needed for cough. 09/30/24  Yes Kenyata Guess, Jodi R, NP  Acetylcysteine (N-ACETYL-L-CYSTEINE) 600 MG CAPS Take by mouth in the morning and at bedtime.    [provider]  atorvastatin (LIPITOR) 20 MG tablet Take 20 mg by mouth daily.    [provider]  b complex vitamins capsule Take 1 capsule by mouth daily.    [provider]  BIOTIN PO Take by mouth.    [provider]  busPIRone (BUSPAR) 15 MG tablet Take 15 mg by mouth 2 (two) times daily.    [provider]  Cholecalciferol (VITAMIN D) 125 MCG (5000 UT) CAPS Take by mouth.    [provider]  Coenzyme Q10 (CO Q 10) 100 MG CAPS Take by mouth daily.    [provider]  furosemide (LASIX) 20 MG tablet Take 20 mg by mouth daily.    [provider]  loratadine (CLARITIN) 10 MG tablet Take 10 mg by mouth daily.    [provider]  MAGNESIUM GLUCONATE PO Take by mouth.    [provider]  metoprolol tartrate (LOPRESSOR) 50 MG tablet Take 50 mg by mouth 2 (two) times daily.    [provider]  montelukast (SINGULAIR) 10 MG tablet Take 10 mg by mouth at bedtime.    [provider]  Multiple Vitamins-Minerals (CENTRUM SILVER PO) Take by mouth.    [provider]  omeprazole (PRILOSEC) 10 MG capsule Take 10 mg by mouth daily as needed.    [provider]  vitamin C (ASCORBIC ACID) 500 MG tablet Take 500 mg by mouth daily.    [provider]  Zinc 50 MG CAPS Take by mouth daily.    [provider]    Family History Family History  Problem Relation Age of Onset   Lung cancer Mother    Lung cancer Sister    Leukemia Paternal Grandmother     Social History Social History   Tobacco Use   Smoking status: Former    Current packs/day: 0.00    Types: Cigarettes    Quit date: 08/20/2002    Years since quitting: 22.1   Smokeless tobacco: Never  Substance Use Topics   Alcohol  use: Yes    Comment: occasional   Drug use: Never     Allergies   Bee venom, Morphine, and Penicillins   Review of Systems Review of Systems  Constitutional:  Positive for fever.  HENT:  Positive for congestion.   Respiratory:  Positive for cough and shortness of breath.      Physical Exam Triage Vital Signs ED Triage Vitals  Encounter Vitals Group     BP 09/30/24 0921 115/73     Girls Systolic BP Percentile --      Girls Diastolic BP Percentile --      Boys Systolic BP Percentile --      Boys Diastolic BP Percentile --      Pulse Rate 09/30/24 0921 69     Resp 09/30/24 0921 18     Temp 09/30/24 0921 98.5 F (36.9 C)     Temp Source 09/30/24 0921 Oral     SpO2 09/30/24 0921 96 %     Weight 09/30/24 0920 214 lb (97.1 kg)     Height --      Head Circumference --      Peak Flow --      Pain Score 09/30/24 0920 3     Pain Loc --      Pain Education --      Exclude from Growth Chart --    No data found.  Updated Vital Signs BP 115/73 (BP Location: Left Arm)   Pulse 69   Temp 98.5 F (36.9 C) (Oral)   Resp 18   Wt 214 lb (97.1 kg)   SpO2 96%   Visual Acuity Right Eye Distance:   Left Eye Distance:   Bilateral Distance:    Right Eye Near:   Left Eye Near:    Bilateral Near:     Physical Exam Vitals and nursing note reviewed.  Constitutional:      General: She is not in acute distress.    Appearance: She is well-developed. She is not ill-appearing.  HENT:     Head: Normocephalic and atraumatic.     Right Ear: Tympanic membrane and ear canal normal.     Left Ear: Tympanic membrane and ear canal normal.     Nose: Congestion present.     Right Turbinates: Pale. Not swollen.     Left Turbinates: Pale. Not swollen.     Right Sinus: No maxillary sinus tenderness or frontal sinus tenderness.     Left Sinus: Frontal sinus tenderness present. No maxillary sinus tenderness.     Mouth/Throat:     Mouth: Mucous membranes are moist.  Pharynx: Oropharynx is  clear. Uvula midline. No oropharyngeal exudate or posterior oropharyngeal erythema.     Tonsils: No tonsillar exudate or tonsillar abscesses.  Eyes:     Conjunctiva/sclera: Conjunctivae normal.     Pupils: Pupils are equal, round, and reactive to light.  Cardiovascular:     Rate and Rhythm: Normal rate and regular rhythm.     Heart sounds: Normal heart sounds.  Pulmonary:     Effort: Pulmonary effort is normal.     Breath sounds: Normal breath sounds. No wheezing, rhonchi or rales.  Musculoskeletal:     Cervical back: Normal range of motion and neck supple.  Lymphadenopathy:     Cervical: No cervical adenopathy.  Skin:    General: Skin is warm and dry.  Neurological:     General: No focal deficit present.     Mental Status: She is alert and oriented to person, place, and time.  Psychiatric:        Mood and Affect: Mood normal.        Behavior: Behavior normal.      UC Treatments / Results  Labs (all labs ordered are listed, but only abnormal results are displayed) Labs Reviewed - No data to display Comprehensive Metabolic Panel Order: [id number] Component Ref Range & Units 3 mo ago  Sodium 135 - 145 mmol/L 143  Potassium 3.4 - 4.8 mmol/L 4.5  Chloride 98 - 107 mmol/L 104  CO2 20.0 - 31.0 mmol/L 30.0  Anion Gap 5 - 14 mmol/L 9  BUN 9 - 23 mg/dL 16  Creatinine 9.44 - 8.97 mg/dL 9.10  BUN/Creatinine Ratio 18  eGFR CKD-EPI (2021) Female >=60 mL/min/1.32m2 72  Comment: eGFR calculated with CKD-EPI 2021 equation in accordance with [employer] and [employer] Task Force recommendations.  Glucose 70 - 99 mg/dL 96  Calcium 8.7 - 89.5 mg/dL 9.5  Albumin 3.4 - 5.0 g/dL 4.2  Total Protein 5.7 - 8.2 g/dL 6.6  Total Bilirubin 0.3 - 1.2 mg/dL 0.6  AST <=65 U/L 32  ALT 10 - 49 U/L 34  Alkaline Phosphatase 46 - 116 U/L 139 High   Resulting Agency [facility]   Specimen Collected: 06/04/24 11:40   Performed  by: [facility] Last Resulted: 06/04/24 15:54  Received From: [facility]  Result Received: 09/30/24 09:07   EKG   Radiology DG Chest 2 View Result Date: 09/30/2024 CLINICAL DATA:  Cough for 2 weeks with nasal drainage and congestion. EXAM: CHEST - 2 VIEW COMPARISON:  01/24/2013. FINDINGS: Trachea is midline. Heart size is stable. Lungs are clear. No pleural fluid. Degenerative changes in the spine. IMPRESSION: No acute findings. Electronically Signed   By: Newell Eke M.D.   On: 09/30/2024 10:51    Procedures Procedures (including critical care time)  Medications Ordered in UC Medications - No data to display  Initial Impression / Assessment and Plan / UC Course  I have reviewed the triage vital signs and the nursing notes.  Pertinent labs & imaging results that were available during my care of the patient were reviewed by me and considered in my medical decision making (see chart for details).     Reviewed exam and symptoms with patient.  Wet read of x-ray without obvious consolidation, will contact for any positive results based on radiology overread.  Discussed lower respiratory infection/bronchitis/frontal sinusitis.  Patient has already been prescribed doxycycline, albuterol, and prednisone which she will pick up at the pharmacy and start.  Will send Promethazine DM as needed for cough, side effect profile reviewed.  Encourage rest fluids and PCP follow-up 2 to 3 days for recheck.  Strict ER precautions reviewed and patient verbalized understanding. Final Clinical Impressions(s) / UC Diagnoses   Final diagnoses:  Acute cough  Lower respiratory infection (e.g., bronchitis, pneumonia, pneumonitis, pulmonitis)  Acute frontal sinusitis, recurrence not specified     Discharge Instructions      You may take Promethazine DM as needed for your cough.  Please note this medication make you drowsy.  Do not drink alcohol or drive on this medication.   Please start the medications that were previously prescribed to you at your telehealth visit including your albuterol inhaler as needed, prednisone and doxycycline.  Monitor your symptoms closely and follow-up with your PCP in 2 to 3 days for recheck.  Please go to the emergency room if you develop any worsening symptoms.  Hope you feel better soon!     ED Prescriptions     Medication Sig Dispense Auth. Provider   promethazine-dextromethorphan (PROMETHAZINE-DM) 6.25-15 MG/5ML syrup Take 5 mLs by mouth 3 (three) times daily as needed for cough. 118 mL Elayah Klooster, Jodi R, NP      PDMP not reviewed this encounter.   Loreda Myla SAUNDERS, NP 09/30/24 1056

## 2024-12-02 ENCOUNTER — Encounter: Admitting: Dietician
